# Patient Record
Sex: Female | Born: 1970 | Race: Asian | Hispanic: No | Smoking: Never smoker
Health system: Southern US, Community
[De-identification: ages and names within clinical notes are randomized; demographics above are authoritative.]

---

## 1999-07-05 ENCOUNTER — Other Ambulatory Visit: Admission: RE | Admit: 1999-07-05 | Discharge: 1999-07-05 | Payer: Self-pay | Admitting: Family Medicine

## 2000-11-10 ENCOUNTER — Other Ambulatory Visit: Admission: RE | Admit: 2000-11-10 | Discharge: 2000-11-10 | Payer: Self-pay | Admitting: Obstetrics

## 2000-11-26 ENCOUNTER — Ambulatory Visit (HOSPITAL_COMMUNITY): Admission: AD | Admit: 2000-11-26 | Discharge: 2000-11-26 | Payer: Self-pay | Admitting: Obstetrics

## 2000-11-26 ENCOUNTER — Encounter: Payer: Self-pay | Admitting: Obstetrics

## 2001-06-16 ENCOUNTER — Other Ambulatory Visit: Admission: RE | Admit: 2001-06-16 | Discharge: 2001-06-16 | Payer: Self-pay | Admitting: Obstetrics and Gynecology

## 2001-06-18 ENCOUNTER — Ambulatory Visit (HOSPITAL_COMMUNITY): Admission: RE | Admit: 2001-06-18 | Discharge: 2001-06-18 | Payer: Self-pay | Admitting: Obstetrics and Gynecology

## 2003-02-18 ENCOUNTER — Encounter: Payer: Self-pay | Admitting: Obstetrics

## 2003-02-18 ENCOUNTER — Ambulatory Visit (HOSPITAL_COMMUNITY): Admission: RE | Admit: 2003-02-18 | Discharge: 2003-02-18 | Payer: Self-pay | Admitting: Obstetrics

## 2003-07-20 ENCOUNTER — Inpatient Hospital Stay (HOSPITAL_COMMUNITY): Admission: AD | Admit: 2003-07-20 | Discharge: 2003-07-22 | Payer: Self-pay | Admitting: Obstetrics

## 2014-11-12 ENCOUNTER — Encounter (HOSPITAL_COMMUNITY): Payer: Self-pay | Admitting: *Deleted

## 2014-11-12 DIAGNOSIS — Z79899 Other long term (current) drug therapy: Secondary | ICD-10-CM | POA: Insufficient documentation

## 2014-11-12 DIAGNOSIS — R42 Dizziness and giddiness: Secondary | ICD-10-CM | POA: Insufficient documentation

## 2014-11-12 DIAGNOSIS — Z3202 Encounter for pregnancy test, result negative: Secondary | ICD-10-CM | POA: Diagnosis not present

## 2014-11-12 DIAGNOSIS — E860 Dehydration: Secondary | ICD-10-CM | POA: Insufficient documentation

## 2014-11-12 DIAGNOSIS — R112 Nausea with vomiting, unspecified: Secondary | ICD-10-CM | POA: Diagnosis not present

## 2014-11-12 LAB — BASIC METABOLIC PANEL
Anion gap: 7 (ref 5–15)
BUN: 8 mg/dL (ref 6–23)
CALCIUM: 9 mg/dL (ref 8.4–10.5)
CO2: 24 mmol/L (ref 19–32)
Chloride: 106 mmol/L (ref 96–112)
Creatinine, Ser: 0.63 mg/dL (ref 0.50–1.10)
GFR calc Af Amer: >90 mL/min (ref >90)
GFR calc non Af Amer: >90 mL/min (ref >90)
Glucose, Bld: 128 mg/dL — ABNORMAL HIGH (ref 70–99)
POTASSIUM: 3.9 mmol/L (ref 3.5–5.1)
Sodium: 137 mmol/L (ref 135–145)

## 2014-11-12 LAB — CBC WITH DIFFERENTIAL/PLATELET
BASOS PCT: 0 % (ref 0–1)
Basophils Absolute: 0 10*3/uL (ref 0.0–0.1)
EOS ABS: 0 10*3/uL (ref 0.0–0.7)
Eosinophils Relative: 0 % (ref 0–5)
HCT: 40 % (ref 36.0–46.0)
Hemoglobin: 13.2 g/dL (ref 12.0–15.0)
LYMPHS PCT: 7 % — AB (ref 12–46)
Lymphs Abs: 0.6 10*3/uL — ABNORMAL LOW (ref 0.7–4.0)
MCH: 28.8 pg (ref 26.0–34.0)
MCHC: 33 g/dL (ref 30.0–36.0)
MCV: 87.3 fL (ref 78.0–100.0)
Monocytes Absolute: 0.3 10*3/uL (ref 0.1–1.0)
Monocytes Relative: 3 % (ref 3–12)
Neutro Abs: 7.7 10*3/uL (ref 1.7–7.7)
Neutrophils Relative %: 90 % — ABNORMAL HIGH (ref 43–77)
PLATELETS: 260 10*3/uL (ref 150–400)
RBC: 4.58 MIL/uL (ref 3.87–5.11)
RDW: 13.3 % (ref 11.5–15.5)
WBC: 8.6 10*3/uL (ref 4.0–10.5)

## 2014-11-12 NOTE — ED Notes (Signed)
Family member states she was getting ready to fix her hear, bent over and stood up and got dizzy.  Made it to the bed and then started throwing up.  No vomiting in triage at this time

## 2014-11-13 ENCOUNTER — Emergency Department (HOSPITAL_COMMUNITY)
Admission: EM | Admit: 2014-11-13 | Discharge: 2014-11-13 | Disposition: A | Discharge status: Home / Self Care | Payer: Non-veteran care | Attending: Emergency Medicine | Admitting: Emergency Medicine

## 2014-11-13 DIAGNOSIS — R112 Nausea with vomiting, unspecified: Secondary | ICD-10-CM

## 2014-11-13 DIAGNOSIS — E860 Dehydration: Secondary | ICD-10-CM

## 2014-11-13 DIAGNOSIS — R42 Dizziness and giddiness: Secondary | ICD-10-CM

## 2014-11-13 LAB — URINALYSIS, ROUTINE W REFLEX MICROSCOPIC
Bilirubin Urine: NEGATIVE
Glucose, UA: NEGATIVE mg/dL
Hgb urine dipstick: NEGATIVE
Ketones, ur: 15 mg/dL — AB
LEUKOCYTES UA: NEGATIVE
Nitrite: NEGATIVE
PROTEIN: NEGATIVE mg/dL
SPECIFIC GRAVITY, URINE: 1.028 (ref 1.005–1.030)
UROBILINOGEN UA: 1 mg/dL (ref 0.0–1.0)
pH: 6 (ref 5.0–8.0)

## 2014-11-13 LAB — POC URINE PREG, ED: Preg Test, Ur: NEGATIVE

## 2014-11-13 MED ORDER — ONDANSETRON HCL 4 MG/2ML IJ SOLN
4.0000 mg | Freq: Four times a day (QID) | INTRAMUSCULAR | Status: DC | PRN
  Administered 2014-11-13: 4 mg via INTRAVENOUS
  Filled 2014-11-13: qty 2

## 2014-11-13 MED ORDER — ONDANSETRON HCL 4 MG PO TABS: 4.0000 mg | ORAL_TABLET | Freq: Three times a day (TID) | ORAL | Status: DC | PRN

## 2014-11-13 MED ORDER — MECLIZINE HCL 25 MG PO TABS: 25.0000 mg | ORAL_TABLET | Freq: Four times a day (QID) | ORAL | Status: DC | PRN

## 2014-11-13 MED ORDER — SODIUM CHLORIDE 0.9 % IV BOLUS (SEPSIS)
500.0000 mL | Freq: Once | INTRAVENOUS | Status: AC
  Administered 2014-11-13: 500 mL via INTRAVENOUS

## 2014-11-13 MED ORDER — MECLIZINE HCL 25 MG PO TABS
25.0000 mg | ORAL_TABLET | Freq: Once | ORAL | Status: AC
  Administered 2014-11-13: 25 mg via ORAL
  Filled 2014-11-13: qty 1

## 2014-11-13 MED ORDER — SODIUM CHLORIDE 0.9 % IV BOLUS (SEPSIS)
1000.0000 mL | Freq: Once | INTRAVENOUS | Status: AC
  Administered 2014-11-13: 1000 mL via INTRAVENOUS

## 2014-11-13 NOTE — ED Provider Notes (Signed)
CSN: [id number]     Arrival date & time 11/12/14  1858 History  This chart was scribed for Ward GivensIva L Jaysten Essner, MD by Murriel HopperAlec Bankhead, ED Scribe. This patient was seen in room A08C/A08C and the patient's care was started at 2:26 AM.     Chief Complaint  Patient presents with  . Emesis  . Dizziness      The history is provided by the patient. No language interpreter was used.    HPI Comments: Cindy Boyd is a 44 y.o. female who presents to the Emergency Department complaining of constant nausea, intermittent dizziness with associated vomiting that has been present since 1240 pm on 11/12/14. Pt states that she bent over to fix her bed when she began to feel dizzy. Pt states that she then stood up and rushed to the bathroom, where she began to vomit. Pt states she currently has the feeling that the room is spinning. Pt states that turning her head to the left and standing up makes her feel dizzy, and lying down makes it better. Pt denies visual disturbance and numbness. She denies any headache. Pt states that she has never had symptoms like this before. Pt states she does not smoke or drink.   [facility] [address]  History reviewed. No pertinent past medical history. History reviewed. No pertinent past surgical history. No family history on file. History  Substance Use Topics  . Smoking status: Never Smoker   . Smokeless tobacco: Never Used  . Alcohol Use: No   Employed Lives at home with husband and 2 sons  OB History    No data available     Review of Systems  Eyes: Negative for visual disturbance.  Gastrointestinal: Positive for nausea and vomiting.  Neurological: Positive for dizziness. Negative for numbness.  All other systems reviewed and are negative.     Allergies  Review of patient's allergies indicates no known allergies.  Home Medications   Prior to Admission medications   Medication Sig Start Date End Date Taking? Authorizing Provider  Vitamin D,  Ergocalciferol, (DRISDOL) 50000 UNITS CAPS capsule Take 50,000 Units by mouth every 7 (seven) days. On Tuesdays   Yes Historical Provider, MD  meclizine (ANTIVERT) 25 MG tablet Take 1 tablet (25 mg total) by mouth every 6 (six) hours as needed for dizziness. 11/13/14   Ward GivensIva L Shamyia Grandpre, MD  ondansetron (ZOFRAN) 4 MG tablet Take 1 tablet (4 mg total) by mouth every 8 (eight) hours as needed for nausea or vomiting (dizziness). 11/13/14   Ward GivensIva L Tamarah Bhullar, MD   BP 123/84 mmHg  Pulse 76  Temp(Src) 98.2 F (36.8 C) (Oral)  Resp 16  Ht 5\' 3"  (1.6 m)  Wt 135 lb (61.236 kg)  BMI 23.92 kg/m2  SpO2 100%  LMP 10/31/2014  Vital signs normal   Physical Exam  Constitutional: She is oriented to person, place, and time. She appears well-developed and well-nourished.  Non-toxic appearance. She does not appear ill. No distress.  Pt is laying with her head turned to the right  HENT:  Head: Normocephalic and atraumatic.  Right Ear: External ear normal.  Left Ear: External ear normal.  Nose: Nose normal. No mucosal edema or rhinorrhea.  Mouth/Throat: Oropharynx is clear and moist and mucous membranes are normal. No dental abscesses or uvula swelling.  Eyes: Conjunctivae and EOM are normal. Pupils are equal, round, and reactive to light. Right eye exhibits no nystagmus. Left eye exhibits no nystagmus.  Neck: Normal range of motion  and full passive range of motion without pain. Neck supple.  Cardiovascular: Normal rate, regular rhythm and normal heart sounds.  Exam reveals no gallop and no friction rub.   No murmur heard. Pulmonary/Chest: Effort normal and breath sounds normal. No respiratory distress. She has no wheezes. She has no rhonchi. She has no rales. She exhibits no tenderness and no crepitus.  Abdominal: Soft. Normal appearance and bowel sounds are normal. She exhibits no distension. There is no tenderness. There is no rebound and no guarding.  Musculoskeletal: Normal range of motion. She exhibits no edema or  tenderness.  Moves all extremities well.   Neurological: She is alert and oriented to person, place, and time. She has normal strength. No cranial nerve deficit.  Grips equal strength  Skin: Skin is warm, dry and intact. No rash noted. No erythema. No pallor.  Psychiatric: She has a normal mood and affect. Her speech is normal and behavior is normal. Her mood appears not anxious.  Nursing note and vitals reviewed.   ED Course  Procedures (including critical care time)  Medications  ondansetron (ZOFRAN) injection 4 mg (4 mg Intravenous Given 11/13/14 0245)  sodium chloride 0.9 % bolus 1,000 mL (0 mLs Intravenous Stopped 11/13/14 0315)  sodium chloride 0.9 % bolus 500 mL (0 mLs Intravenous Stopped 11/13/14 0417)  meclizine (ANTIVERT) tablet 25 mg (25 mg Oral Given 11/13/14 0245)  meclizine (ANTIVERT) tablet 25 mg (25 mg Oral Given 11/13/14 0441)     DIAGNOSTIC STUDIES: Oxygen Saturation is 100% on RA, normal by my interpretation.    COORDINATION OF CARE: 2:33 AM Discussed treatment plan with pt at bedside and pt agreed to plan.  Recheck 04:45 pt was feeling better after initial meds but when nurses noted when they tried to get her up she was unable to stand.  At time of discharge she was able to ambulate but did hold onto the railing, which is much improved per her husband. Pt feels ready to be discharged.   Labs Review Results for orders placed or performed during the hospital encounter of 11/13/14  Urinalysis, Routine w reflex microscopic  Result Value Ref Range   Color, Urine YELLOW YELLOW   APPearance CLEAR CLEAR   Specific Gravity, Urine 1.028 1.005 - 1.030   pH 6.0 5.0 - 8.0   Glucose, UA NEGATIVE NEGATIVE mg/dL   Hgb urine dipstick NEGATIVE NEGATIVE   Bilirubin Urine NEGATIVE NEGATIVE   Ketones, ur 15 (A) NEGATIVE mg/dL   Protein, ur NEGATIVE NEGATIVE mg/dL   Urobilinogen, UA 1.0 0.0 - 1.0 mg/dL   Nitrite NEGATIVE NEGATIVE   Leukocytes, UA NEGATIVE NEGATIVE  CBC with  Differential  Result Value Ref Range   WBC 8.6 4.0 - 10.5 K/uL   RBC 4.58 3.87 - 5.11 MIL/uL   Hemoglobin 13.2 12.0 - 15.0 g/dL   HCT 16.1 09.6 - 04.5 %   MCV 87.3 78.0 - 100.0 fL   MCH 28.8 26.0 - 34.0 pg   MCHC 33.0 30.0 - 36.0 g/dL   RDW 40.9 81.1 - 91.4 %   Platelets 260 150 - 400 K/uL   Neutrophils Relative % 90 (H) 43 - 77 %   Neutro Abs 7.7 1.7 - 7.7 K/uL   Lymphocytes Relative 7 (L) 12 - 46 %   Lymphs Abs 0.6 (L) 0.7 - 4.0 K/uL   Monocytes Relative 3 3 - 12 %   Monocytes Absolute 0.3 0.1 - 1.0 K/uL   Eosinophils Relative 0 0 - 5 %  Eosinophils Absolute 0.0 0.0 - 0.7 K/uL   Basophils Relative 0 0 - 1 %   Basophils Absolute 0.0 0.0 - 0.1 K/uL  Basic metabolic panel  Result Value Ref Range   Sodium 137 135 - 145 mmol/L   Potassium 3.9 3.5 - 5.1 mmol/L   Chloride 106 96 - 112 mmol/L   CO2 24 19 - 32 mmol/L   Glucose, Bld 128 (H) 70 - 99 mg/dL   BUN 8 6 - 23 mg/dL   Creatinine, Ser 1.61 0.50 - 1.10 mg/dL   Calcium 9.0 8.4 - 09.6 mg/dL   GFR calc non Af Amer >90 >90 mL/min   GFR calc Af Amer >90 >90 mL/min   Anion gap 7 5 - 15  POC Urine Pregnancy, ED (do NOT order at [facility])  Result Value Ref Range   Preg Test, Ur NEGATIVE NEGATIVE    Laboratory interpretation all normal      Imaging Review No results found.   EKG Interpretation None      MDM   Final diagnoses:  Vertigo  Nausea and vomiting, vomiting of unspecified type  Dehydration    New Prescriptions   MECLIZINE (ANTIVERT) 25 MG TABLET    Take 1 tablet (25 mg total) by mouth every 6 (six) hours as needed for dizziness.   ONDANSETRON (ZOFRAN) 4 MG TABLET    Take 1 tablet (4 mg total) by mouth every 8 (eight) hours as needed for nausea or vomiting (dizziness).    Plan discharge   I personally performed the services described in this documentation, which was scribed in my presence. The recorded information has been reviewed and considered.  Devoria Albe, MD, FACEP      Ward Givens, MD 11/13/14  [phone]

## 2014-11-13 NOTE — Discharge Instructions (Signed)
Drink plenty of fluids. Use the zofran for nausea and vomiting. Take the meclizine for dizziness. Recheck if you get a headache, your dizziness isn't improving in the next couple of days or you get worse (headache, get dehydrated, falling).    Vertigo Vertigo means you feel like you or your surroundings are moving when they are not. Vertigo can be dangerous if it occurs when you are at work, driving, or performing difficult activities.  CAUSES  Vertigo occurs when there is a conflict of signals sent to your brain from the visual and sensory systems in your body. There are many different causes of vertigo, including:  Infections, especially in the inner ear.  A bad reaction to a drug or misuse of alcohol and medicines.  Withdrawal from drugs or alcohol.  Rapidly changing positions, such as lying down or rolling over in bed.  A migraine headache.  Decreased blood flow to the brain.  Increased pressure in the brain from a head injury, infection, tumor, or bleeding. SYMPTOMS  You may feel as though the world is spinning around or you are falling to the ground. Because your balance is upset, vertigo can cause nausea and vomiting. You may have involuntary eye movements (nystagmus). DIAGNOSIS  Vertigo is usually diagnosed by physical exam. If the cause of your vertigo is unknown, your caregiver may perform imaging tests, such as an MRI scan (magnetic resonance imaging). TREATMENT  Most cases of vertigo resolve on their own, without treatment. Depending on the cause, your caregiver may prescribe certain medicines. If your vertigo is related to body position issues, your caregiver may recommend movements or procedures to correct the problem. In rare cases, if your vertigo is caused by certain inner ear problems, you may need surgery. HOME CARE INSTRUCTIONS   Follow your caregiver's instructions.  Avoid driving.  Avoid operating heavy machinery.  Avoid performing any tasks that would be  dangerous to you or others during a vertigo episode.  Tell your caregiver if you notice that certain medicines seem to be causing your vertigo. Some of the medicines used to treat vertigo episodes can actually make them worse in some people. SEEK IMMEDIATE MEDICAL CARE IF:   Your medicines do not relieve your vertigo or are making it worse.  You develop problems with talking, walking, weakness, or using your arms, hands, or legs.  You develop severe headaches.  Your nausea or vomiting continues or gets worse.  You develop visual changes.  A family member notices behavioral changes.  Your condition gets worse. MAKE SURE YOU:  Understand these instructions.  Will watch your condition.  Will get help right away if you are not doing well or get worse. Document Released: 06/12/2005 Document Revised: 11/25/2011 Document Reviewed: 03/21/2011 [facility] Patient Information 2015 [employer] [place]. This information is not intended to replace advice given to you by your health care provider. Make sure you discuss any questions you have with your health care provider.

## 2015-09-15 ENCOUNTER — Other Ambulatory Visit: Payer: Self-pay | Admitting: Family Medicine

## 2015-09-15 DIAGNOSIS — Z1231 Encounter for screening mammogram for malignant neoplasm of breast: Secondary | ICD-10-CM

## 2015-10-06 ENCOUNTER — Ambulatory Visit: Payer: Self-pay

## 2015-10-06 ENCOUNTER — Ambulatory Visit
Admission: RE | Admit: 2015-10-06 | Discharge: 2015-10-06 | Disposition: A | Discharge status: Home / Self Care | Source: Ambulatory Visit | Attending: Family Medicine | Admitting: Family Medicine

## 2015-10-06 DIAGNOSIS — Z1231 Encounter for screening mammogram for malignant neoplasm of breast: Secondary | ICD-10-CM

## 2016-09-19 ENCOUNTER — Other Ambulatory Visit: Payer: Self-pay | Admitting: Family Medicine

## 2016-09-19 DIAGNOSIS — Z1231 Encounter for screening mammogram for malignant neoplasm of breast: Secondary | ICD-10-CM

## 2016-10-07 ENCOUNTER — Ambulatory Visit
Admission: RE | Admit: 2016-10-07 | Discharge: 2016-10-07 | Disposition: A | Discharge status: Home / Self Care | Source: Ambulatory Visit | Attending: Family Medicine | Admitting: Family Medicine

## 2016-10-07 DIAGNOSIS — Z1231 Encounter for screening mammogram for malignant neoplasm of breast: Secondary | ICD-10-CM

## 2018-09-06 ENCOUNTER — Encounter (HOSPITAL_COMMUNITY): Payer: Self-pay | Admitting: Emergency Medicine

## 2018-09-06 ENCOUNTER — Emergency Department (HOSPITAL_COMMUNITY)
Admission: EM | Admit: 2018-09-06 | Discharge: 2018-09-06 | Disposition: A | Discharge status: Home / Self Care | Attending: Emergency Medicine | Admitting: Emergency Medicine

## 2018-09-06 ENCOUNTER — Other Ambulatory Visit: Payer: Self-pay

## 2018-09-06 DIAGNOSIS — R42 Dizziness and giddiness: Secondary | ICD-10-CM | POA: Insufficient documentation

## 2018-09-06 MED ORDER — LORAZEPAM 2 MG/ML IJ SOLN
1.0000 mg | Freq: Once | INTRAMUSCULAR | Status: AC
  Administered 2018-09-06: 1 mg via INTRAVENOUS
  Filled 2018-09-06: qty 1

## 2018-09-06 MED ORDER — ONDANSETRON HCL 8 MG PO TABS: 8.0000 mg | ORAL_TABLET | Freq: Three times a day (TID) | ORAL | 0 refills | Status: AC | PRN

## 2018-09-06 MED ORDER — SODIUM CHLORIDE 0.9 % IV BOLUS
1000.0000 mL | Freq: Once | INTRAVENOUS | Status: AC
  Administered 2018-09-06: 1000 mL via INTRAVENOUS

## 2018-09-06 MED ORDER — MECLIZINE HCL 25 MG PO TABS
12.5000 mg | ORAL_TABLET | Freq: Once | ORAL | Status: AC
  Administered 2018-09-06: 12.5 mg via ORAL
  Filled 2018-09-06: qty 1

## 2018-09-06 MED ORDER — ONDANSETRON HCL 4 MG/2ML IJ SOLN
4.0000 mg | Freq: Once | INTRAMUSCULAR | Status: AC
  Administered 2018-09-06: 4 mg via INTRAVENOUS
  Filled 2018-09-06: qty 2

## 2018-09-06 MED ORDER — MECLIZINE HCL 12.5 MG PO TABS: 12.5000 mg | ORAL_TABLET | Freq: Three times a day (TID) | ORAL | 0 refills | Status: AC | PRN

## 2018-09-06 NOTE — Discharge Instructions (Addendum)
Make sure you are getting plenty of rest and drinking a lot of water.  See the doctor of your choice as needed for problems.

## 2018-09-06 NOTE — ED Triage Notes (Signed)
Pt here with c/o dizziness along with N/V that began today.  Pt states she threw up in excess of 20x's today.  Pt  noted she had similar episode with an ear infection 2 years ago.

## 2018-09-06 NOTE — ED Provider Notes (Signed)
[facility] Provider Note   CSN: [id number] Arrival date & time: 09/06/18  1732     History   Chief Complaint Chief Complaint  Patient presents with  . Emesis  . Dizziness    HPI Cindy Boyd is a 47 y.o. female.  HPI   She presents for evaluation dizziness onset today, precipitated by turning her head, with associated nausea and vomiting.  She has vomited numerous times, with emesis appearing like stomach contents.  No blood in emesis.  She denies headache, blurred vision, ear pain, cough, shortness of breath, weakness or paresthesia.  History of similar problem in the past diagnosed with "inner ear problem."  No known sick contacts.  There are no other known modifying factors.  History reviewed. No pertinent past medical history.  There are no active problems to display for this patient.   History reviewed. No pertinent surgical history.   OB History   No obstetric history on file.      Home Medications    Prior to Admission medications   Medication Sig Start Date End Date Taking? Authorizing Provider  meclizine (ANTIVERT) 12.5 MG tablet Take 1 tablet (12.5 mg total) by mouth 3 (three) times daily as needed for dizziness. 09/06/18   Mancel BaleWentz, Ora Bollig, MD  ondansetron (ZOFRAN) 8 MG tablet Take 1 tablet (8 mg total) by mouth every 8 (eight) hours as needed for nausea or vomiting. 09/06/18   Mancel BaleWentz, Jacen Carlini, MD  Vitamin D, Ergocalciferol, (DRISDOL) 50000 UNITS CAPS capsule Take 50,000 Units by mouth every 7 (seven) days. On Tuesdays    [provider]    Family History History reviewed. No pertinent family history.  Social History Social History   Tobacco Use  . Smoking status: Never Smoker  . Smokeless tobacco: Never Used  Substance Use Topics  . Alcohol use: No  . Drug use: No     Allergies   Patient has no known allergies.   Review of Systems Review of Systems  All other systems reviewed and are  negative.    Physical Exam Updated Vital Signs BP 124/87   Pulse 80   Temp 98 F (36.7 C) (Oral)   Resp 18   Ht 5\' 3"  (1.6 m)   Wt 61.2 kg   LMP 08/23/2018   SpO2 99%   BMI 23.91 kg/m   Physical Exam Vitals signs and nursing note reviewed.  Constitutional:      Appearance: Normal appearance. She is well-developed.  HENT:     Head: Normocephalic and atraumatic.     Right Ear: External ear normal.     Left Ear: External ear normal.  Eyes:     Conjunctiva/sclera: Conjunctivae normal.     Pupils: Pupils are equal, round, and reactive to light.  Neck:     Musculoskeletal: Normal range of motion and neck supple.     Trachea: Phonation normal.  Cardiovascular:     Rate and Rhythm: Normal rate and regular rhythm.     Heart sounds: Normal heart sounds.  Pulmonary:     Effort: Pulmonary effort is normal.     Breath sounds: Normal breath sounds.  Abdominal:     Palpations: Abdomen is soft.     Tenderness: There is no abdominal tenderness.  Musculoskeletal: Normal range of motion.  Skin:    General: Skin is warm and dry.  Neurological:     Mental Status: She is alert and oriented to person, place, and time.  Cranial Nerves: No cranial nerve deficit.     Sensory: No sensory deficit.     Motor: No abnormal muscle tone.     Coordination: Coordination normal.     Comments: No dysarthria, aphasia or nystagmus.  No pronator drift.  Negative head impulse testing, negative test of skew.  Psychiatric:        Behavior: Behavior normal.        Thought Content: Thought content normal.        Judgment: Judgment normal.      ED Treatments / Results  Labs (all labs ordered are listed, but only abnormal results are displayed) Labs Reviewed - No data to display  EKG EKG Interpretation  Date/Time:  Sunday September 06 2018 17:40:48 EST Ventricular Rate:  91 PR Interval:    QRS Duration: 82 QT Interval:  400 QTC Calculation: 493 R Axis:   59 Text Interpretation:  Sinus  rhythm Borderline prolonged QT interval No old tracing to compare Confirmed by Mancel BaleWentz, Ronika Kelson [phone]([id number]) on 09/06/2018 5:44:04 PM   Radiology No results found.  Procedures Procedures (including critical care time)  Medications Ordered in ED Medications  sodium chloride 0.9 % bolus 1,000 mL (0 mLs Intravenous Stopped 09/06/18 2032)  ondansetron (ZOFRAN) injection 4 mg (4 mg Intravenous Given 09/06/18 1830)  LORazepam (ATIVAN) injection 1 mg (1 mg Intravenous Given 09/06/18 1830)  meclizine (ANTIVERT) tablet 12.5 mg (12.5 mg Oral Given 09/06/18 2030)     Initial Impression / Assessment and Plan / ED Course  I have reviewed the triage vital signs and the nursing notes.  Pertinent labs & imaging results that were available during my care of the patient were reviewed by me and considered in my medical decision making (see chart for details).  Clinical Course as of Sep 07 2139  Wynelle LinkSun Sep 06, 2018  2003 Currently patient sleeping after Ativan and Zofran.  When awakened she still feels dizzy but is no longer nauseated.  Oral meclizine ordered.   [EW]    Clinical Course User Index [EW] Mancel BaleWentz, Krystalynn Ridgeway, MD     Patient Vitals for the past 24 hrs:  BP Temp Temp src Pulse Resp SpO2 Height Weight  09/06/18 2000 124/87 - - 80 18 99 % - -  09/06/18 1930 133/87 - - 79 18 99 % - -  09/06/18 1900 (!) 135/95 - - 77 17 100 % - -  09/06/18 1748 - - - 81 16 100 % - -  09/06/18 1745 (!) 151/91 - - 88 12 100 % 5\' 3"  (1.6 m) 61.2 kg  09/06/18 1740 - 98 F (36.7 C) Oral - - - - -  09/06/18 1738 (!) 142/90 - - 88 - 100 % - -    9:40 PM Reevaluation with update and discussion. After initial assessment and treatment, an updated evaluation reveals she states she is feeling better and ready to go home at this time.  Findings discussed and questions answered. Mancel BaleElliott Jourdan Durbin   Medical Decision Making: Recurrent vertigo likely peripheral vestibulopathy.  Doubt serious bacterial infection, metabolic instability  or impending vascular collapse.  Doubt central process.  CRITICAL CARE-no Performed by: Mancel BaleElliott Truc Winfree  Nursing Notes Reviewed/ Care Coordinated Applicable Imaging Reviewed Interpretation of Laboratory Data incorporated into ED treatment  The patient appears reasonably screened and/or stabilized for discharge and I doubt any other medical condition or other [facility] requiring further screening, evaluation, or treatment in the ED at this time prior to discharge.  Plan: Home Medications-OTC as needed; Home  Treatments-rest, fluids; return here if the recommended treatment, does not improve the symptoms; Recommended follow up-PCP, PRN   Final Clinical Impressions(s) / ED Diagnoses   Final diagnoses:  Vertigo    ED Discharge Orders         Ordered    meclizine (ANTIVERT) 12.5 MG tablet  3 times daily PRN     09/06/18 2139    ondansetron (ZOFRAN) 8 MG tablet  Every 8 hours PRN     09/06/18 2139           Mancel Bale, MD 09/06/18 2141

## 2018-09-06 NOTE — ED Notes (Signed)
Nausea resolved.  Dizziness has decreased.

## 2019-02-15 ENCOUNTER — Other Ambulatory Visit: Payer: Self-pay | Admitting: Family Medicine

## 2019-02-15 ENCOUNTER — Other Ambulatory Visit (HOSPITAL_COMMUNITY)
Admission: RE | Admit: 2019-02-15 | Discharge: 2019-02-15 | Disposition: A | Discharge status: Home / Self Care | Source: Ambulatory Visit | Attending: Family Medicine | Admitting: Family Medicine

## 2019-02-15 DIAGNOSIS — Z0000 Encounter for general adult medical examination without abnormal findings: Secondary | ICD-10-CM | POA: Insufficient documentation

## 2019-02-19 LAB — CYTOLOGY - PAP: Diagnosis: NEGATIVE

## 2019-03-01 ENCOUNTER — Other Ambulatory Visit: Payer: Self-pay | Admitting: Family Medicine

## 2019-03-01 DIAGNOSIS — N946 Dysmenorrhea, unspecified: Secondary | ICD-10-CM

## 2019-03-04 ENCOUNTER — Other Ambulatory Visit: Payer: Self-pay | Admitting: Family Medicine

## 2019-03-04 DIAGNOSIS — Z1231 Encounter for screening mammogram for malignant neoplasm of breast: Secondary | ICD-10-CM

## 2019-03-12 ENCOUNTER — Ambulatory Visit
Admission: RE | Admit: 2019-03-12 | Discharge: 2019-03-12 | Disposition: A | Discharge status: Home / Self Care | Source: Ambulatory Visit | Attending: Family Medicine | Admitting: Family Medicine

## 2019-03-12 DIAGNOSIS — N946 Dysmenorrhea, unspecified: Secondary | ICD-10-CM

## 2019-03-24 ENCOUNTER — Ambulatory Visit
Admission: RE | Admit: 2019-03-24 | Discharge: 2019-03-24 | Disposition: A | Discharge status: Home / Self Care | Source: Ambulatory Visit | Attending: Family Medicine | Admitting: Family Medicine

## 2019-03-24 ENCOUNTER — Other Ambulatory Visit: Payer: Self-pay

## 2019-03-24 DIAGNOSIS — Z1231 Encounter for screening mammogram for malignant neoplasm of breast: Secondary | ICD-10-CM

## 2019-03-25 ENCOUNTER — Other Ambulatory Visit: Payer: Self-pay | Admitting: Family Medicine

## 2019-03-25 DIAGNOSIS — R928 Other abnormal and inconclusive findings on diagnostic imaging of breast: Secondary | ICD-10-CM

## 2019-03-30 ENCOUNTER — Ambulatory Visit
Admission: RE | Admit: 2019-03-30 | Discharge: 2019-03-30 | Disposition: A | Discharge status: Home / Self Care | Source: Ambulatory Visit | Attending: Family Medicine | Admitting: Family Medicine

## 2019-03-30 ENCOUNTER — Other Ambulatory Visit: Payer: Self-pay

## 2019-03-30 DIAGNOSIS — R928 Other abnormal and inconclusive findings on diagnostic imaging of breast: Secondary | ICD-10-CM

## 2019-09-14 ENCOUNTER — Other Ambulatory Visit: Payer: Self-pay | Admitting: Obstetrics and Gynecology

## 2020-02-16 ENCOUNTER — Other Ambulatory Visit (HOSPITAL_COMMUNITY)
Admission: RE | Admit: 2020-02-16 | Discharge: 2020-02-16 | Disposition: A | Discharge status: Home / Self Care | Source: Ambulatory Visit | Attending: Family Medicine | Admitting: Family Medicine

## 2020-02-16 ENCOUNTER — Other Ambulatory Visit: Payer: Self-pay | Admitting: Family Medicine

## 2020-02-16 DIAGNOSIS — Z124 Encounter for screening for malignant neoplasm of cervix: Secondary | ICD-10-CM | POA: Insufficient documentation

## 2020-02-21 ENCOUNTER — Other Ambulatory Visit: Payer: Self-pay | Admitting: Family Medicine

## 2020-02-21 DIAGNOSIS — Z1231 Encounter for screening mammogram for malignant neoplasm of breast: Secondary | ICD-10-CM

## 2020-02-21 LAB — CYTOLOGY - PAP
Comment: NEGATIVE
Diagnosis: NEGATIVE
High risk HPV: NEGATIVE

## 2020-03-27 ENCOUNTER — Ambulatory Visit
Admission: RE | Admit: 2020-03-27 | Discharge: 2020-03-27 | Disposition: A | Discharge status: Home / Self Care | Source: Ambulatory Visit | Attending: Family Medicine | Admitting: Family Medicine

## 2020-03-27 ENCOUNTER — Other Ambulatory Visit: Payer: Self-pay

## 2020-03-27 DIAGNOSIS — Z1231 Encounter for screening mammogram for malignant neoplasm of breast: Secondary | ICD-10-CM

## 2021-02-19 ENCOUNTER — Other Ambulatory Visit: Payer: Self-pay | Admitting: Family Medicine

## 2021-02-19 DIAGNOSIS — Z1231 Encounter for screening mammogram for malignant neoplasm of breast: Secondary | ICD-10-CM

## 2021-04-12 ENCOUNTER — Ambulatory Visit

## 2021-05-08 ENCOUNTER — Other Ambulatory Visit: Payer: Self-pay

## 2021-05-08 ENCOUNTER — Ambulatory Visit
Admission: RE | Admit: 2021-05-08 | Discharge: 2021-05-08 | Disposition: A | Discharge status: Home / Self Care | Source: Ambulatory Visit | Attending: Family Medicine | Admitting: Family Medicine

## 2021-05-08 DIAGNOSIS — Z1231 Encounter for screening mammogram for malignant neoplasm of breast: Secondary | ICD-10-CM

## 2022-05-20 IMAGING — MG MM DIGITAL SCREENING BILAT W/ TOMO AND CAD
8 series · 8 of 24 positions shown · non-contrast
Comparison: Previous exam(s).

CLINICAL DATA: Screening.

EXAM:
DIGITAL SCREENING BILATERAL MAMMOGRAM WITH TOMOSYNTHESIS AND CAD
TECHNIQUE: Bilateral screening digital craniocaudal and mediolateral oblique
mammograms were obtained. Bilateral screening digital breast
tomosynthesis was performed. The images were evaluated with
computer-aided detection.

[L CC synth-2D]
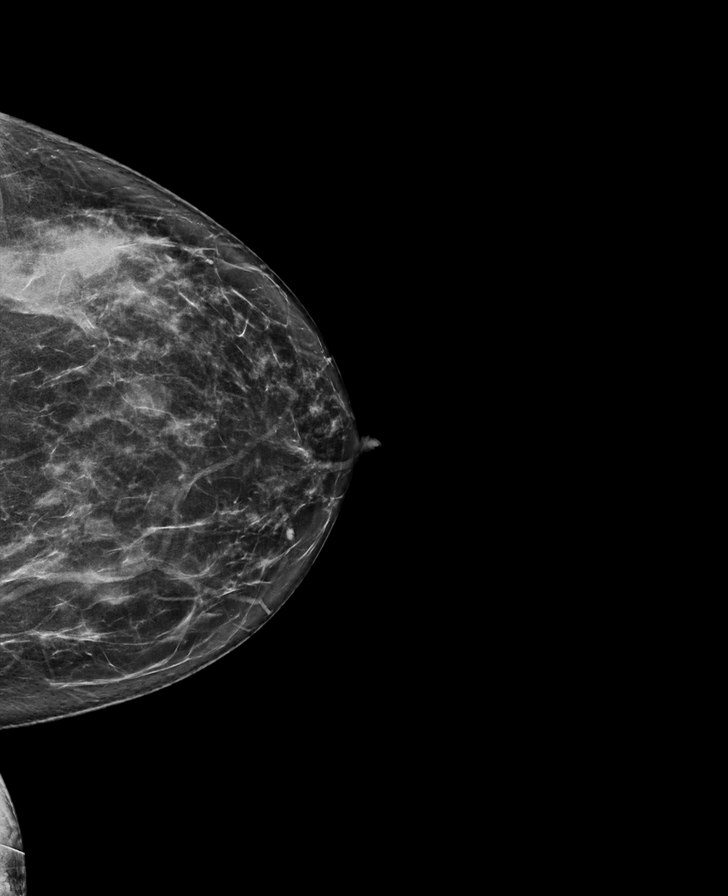

[L MLO synth-2D]
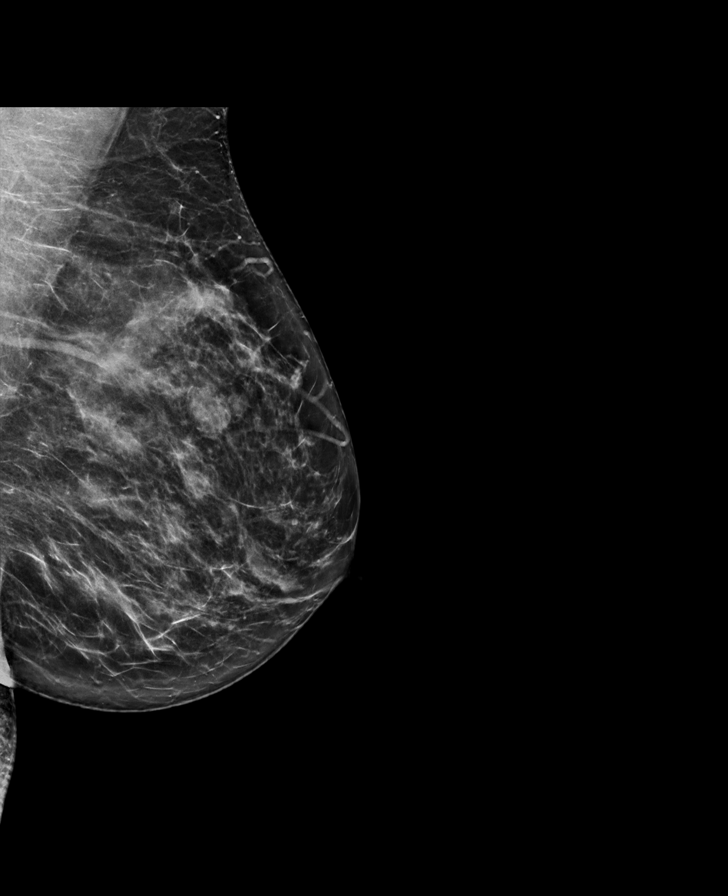

[R MLO synth-2D]
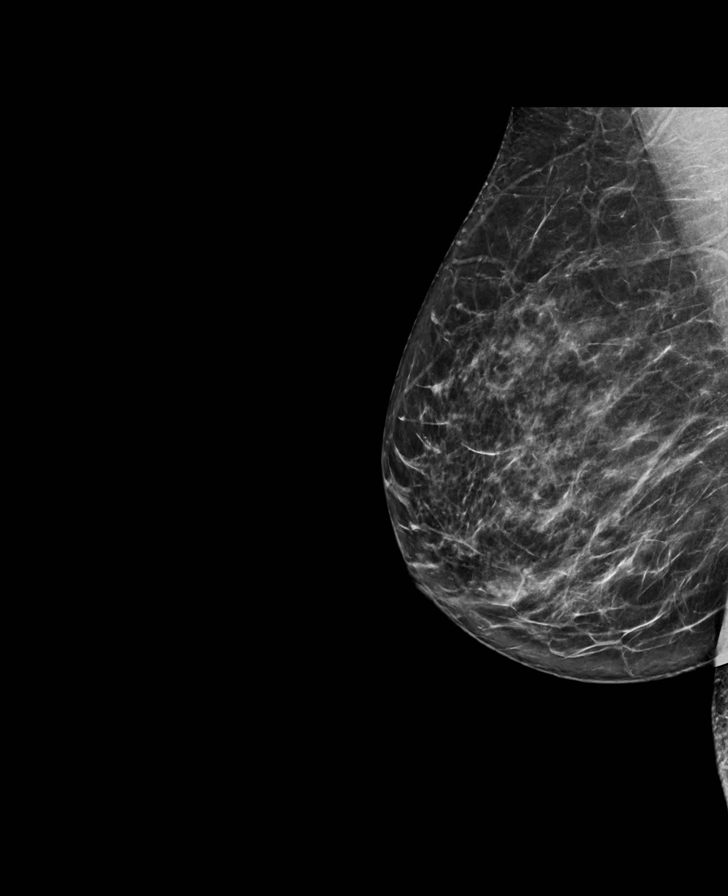

[R CC synth-2D]
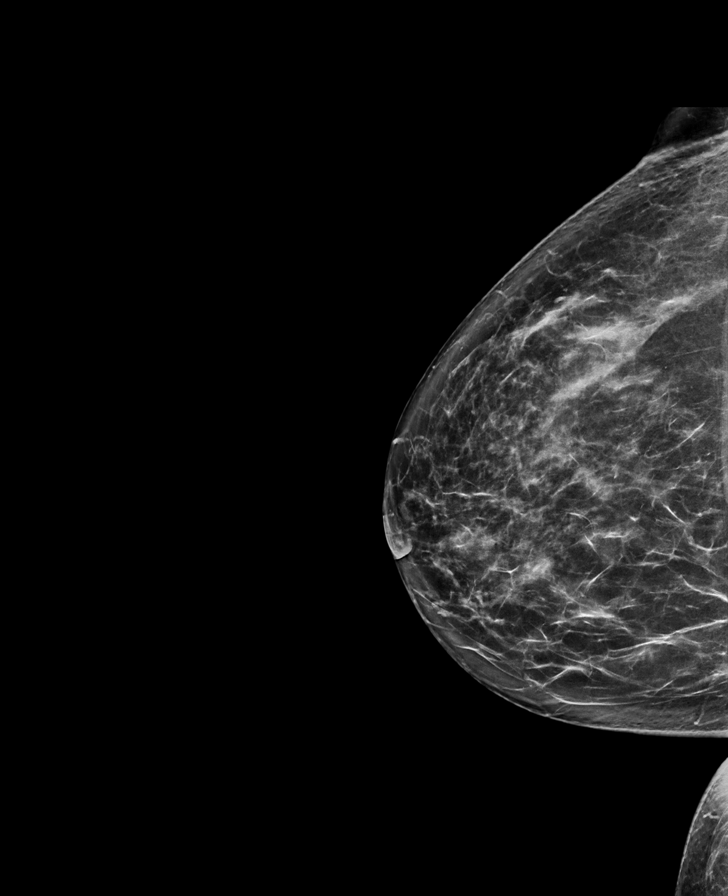

[L CC tomo · tomo slice 37/72.0]
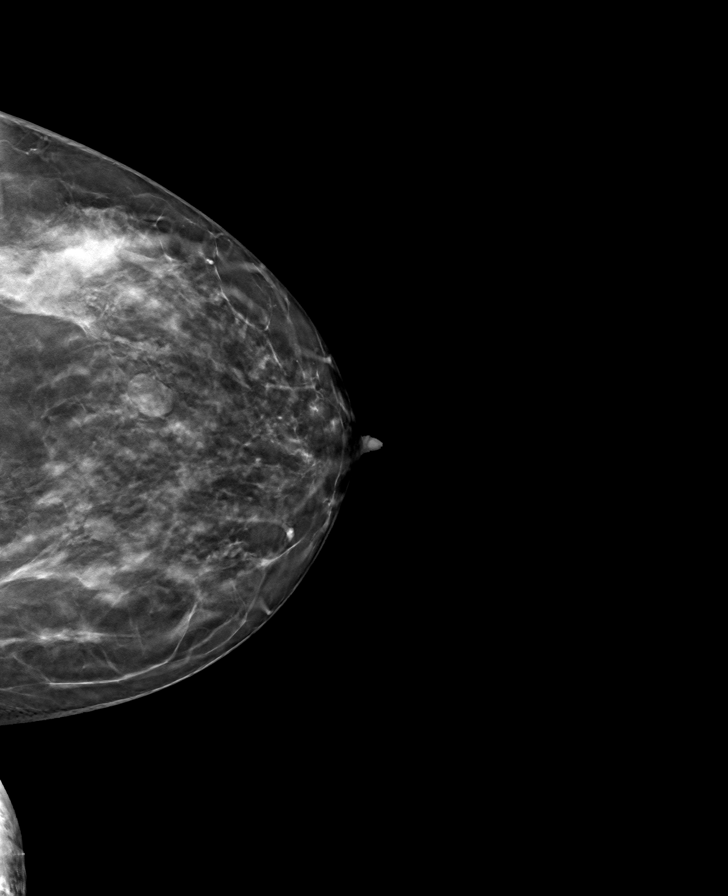

[R CC tomo · tomo slice 38/75.0]
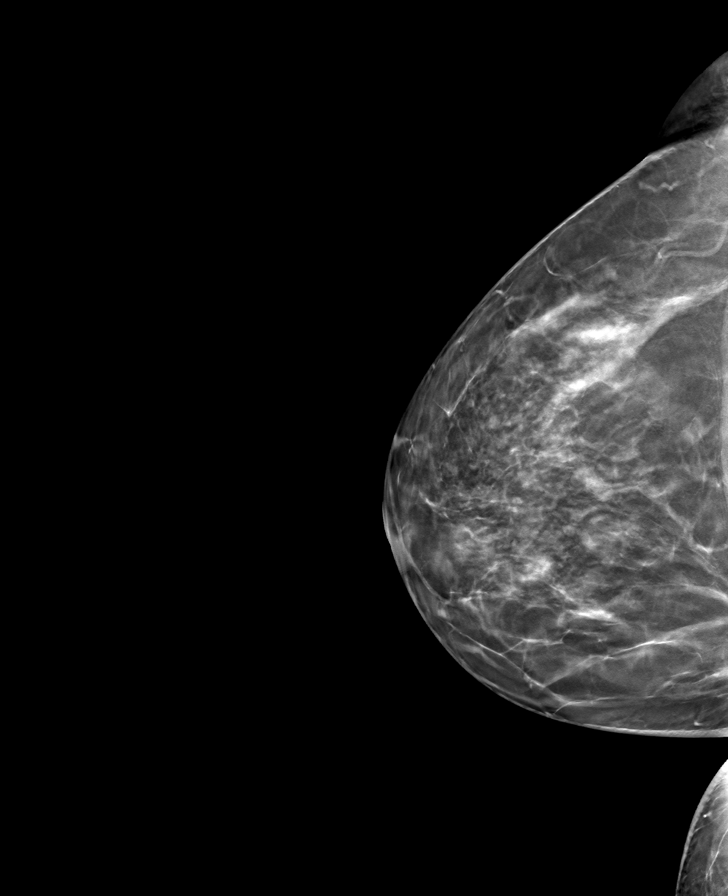

[L MLO tomo · tomo slice 38/75.0]
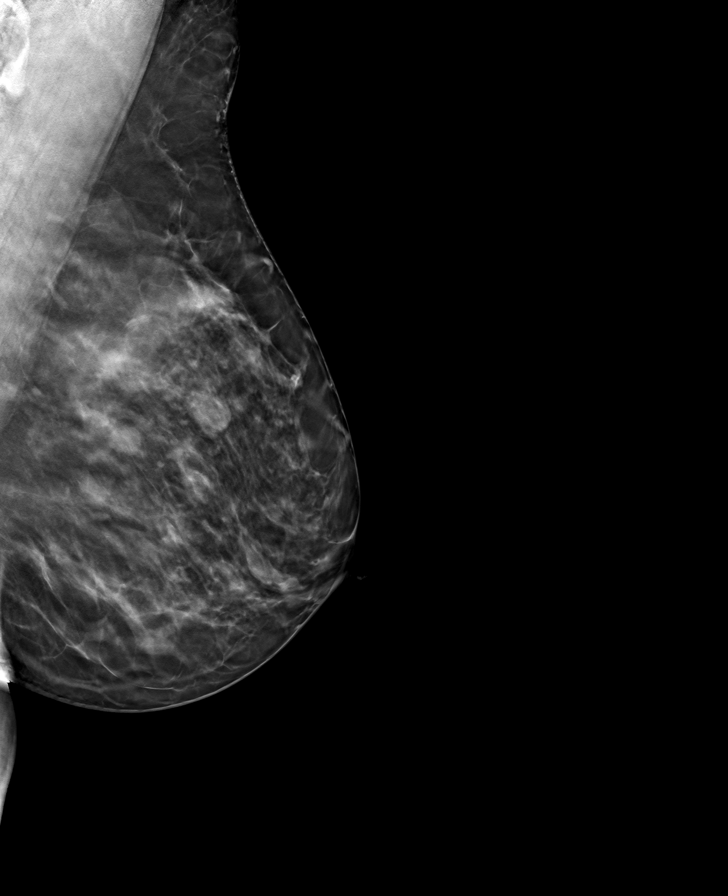

[R MLO tomo · tomo slice 37/73.0]
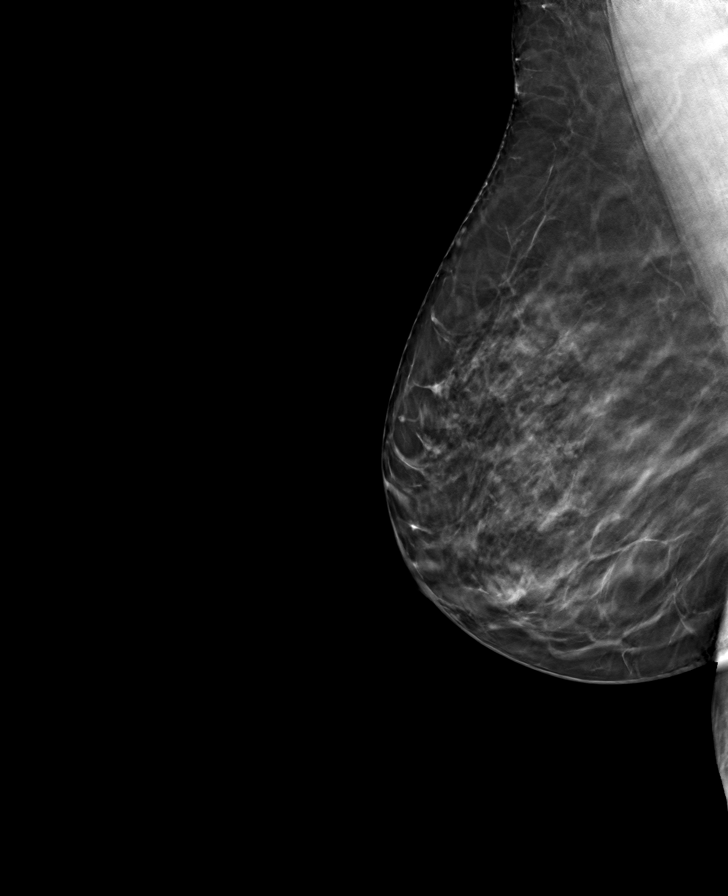

[8 of 24 positions shown; findings below may reference images not displayed]

ACR Breast Density Category b: There are scattered areas of
fibroglandular density.
FINDINGS: There are no findings suspicious for malignancy.
IMPRESSION: No mammographic evidence of malignancy. A result letter of this
screening mammogram will be mailed directly to the patient.

RECOMMENDATION:
Screening mammogram in one year. (Code:51-O-LD2)

BI-RADS CATEGORY  1: Negative.

## 2022-06-17 ENCOUNTER — Other Ambulatory Visit: Payer: Self-pay | Admitting: Family Medicine

## 2022-06-17 DIAGNOSIS — Z1231 Encounter for screening mammogram for malignant neoplasm of breast: Secondary | ICD-10-CM

## 2022-06-18 ENCOUNTER — Other Ambulatory Visit: Payer: Self-pay | Admitting: Family Medicine

## 2022-06-18 DIAGNOSIS — Z1231 Encounter for screening mammogram for malignant neoplasm of breast: Secondary | ICD-10-CM

## 2022-07-01 ENCOUNTER — Ambulatory Visit
Admission: RE | Admit: 2022-07-01 | Discharge: 2022-07-01 | Disposition: A | Discharge status: Home / Self Care | Source: Ambulatory Visit | Attending: Family Medicine | Admitting: Family Medicine

## 2022-07-01 DIAGNOSIS — Z1231 Encounter for screening mammogram for malignant neoplasm of breast: Secondary | ICD-10-CM

## 2022-07-03 ENCOUNTER — Other Ambulatory Visit: Payer: Self-pay | Admitting: Family Medicine

## 2022-07-03 DIAGNOSIS — R928 Other abnormal and inconclusive findings on diagnostic imaging of breast: Secondary | ICD-10-CM

## 2022-07-10 ENCOUNTER — Ambulatory Visit

## 2022-07-11 ENCOUNTER — Other Ambulatory Visit: Payer: Self-pay | Admitting: Family Medicine

## 2022-07-11 ENCOUNTER — Ambulatory Visit
Admission: RE | Admit: 2022-07-11 | Discharge: 2022-07-11 | Disposition: A | Discharge status: Home / Self Care | Source: Ambulatory Visit | Attending: Family Medicine | Admitting: Family Medicine

## 2022-07-11 DIAGNOSIS — R928 Other abnormal and inconclusive findings on diagnostic imaging of breast: Secondary | ICD-10-CM

## 2023-05-12 ENCOUNTER — Other Ambulatory Visit: Payer: Self-pay | Admitting: Family Medicine

## 2023-05-12 DIAGNOSIS — Z1231 Encounter for screening mammogram for malignant neoplasm of breast: Secondary | ICD-10-CM

## 2023-07-14 ENCOUNTER — Ambulatory Visit

## 2023-08-05 ENCOUNTER — Ambulatory Visit
Admission: RE | Admit: 2023-08-05 | Discharge: 2023-08-05 | Disposition: A | Discharge status: Home / Self Care | Source: Ambulatory Visit | Attending: Family Medicine | Admitting: Family Medicine

## 2023-08-05 DIAGNOSIS — Z1231 Encounter for screening mammogram for malignant neoplasm of breast: Secondary | ICD-10-CM

## 2024-07-27 ENCOUNTER — Other Ambulatory Visit: Payer: Self-pay | Admitting: Family Medicine

## 2024-07-27 DIAGNOSIS — Z1231 Encounter for screening mammogram for malignant neoplasm of breast: Secondary | ICD-10-CM

## 2024-08-24 ENCOUNTER — Ambulatory Visit: Admission: RE | Admit: 2024-08-24 | Discharge: 2024-08-24 | Disposition: A | Source: Ambulatory Visit

## 2024-08-24 DIAGNOSIS — Z1231 Encounter for screening mammogram for malignant neoplasm of breast: Secondary | ICD-10-CM
